# Patient Record
Sex: Male | Born: 1975
Health system: Southern US, Community
[De-identification: ages and names within clinical notes are randomized; demographics above are authoritative.]

## PROBLEM LIST (undated history)

## (undated) DIAGNOSIS — E559 Vitamin D deficiency, unspecified: Secondary | ICD-10-CM

## (undated) HISTORY — PX: OTHER SURGICAL HISTORY: SHX169

## (undated) HISTORY — DX: Vitamin D deficiency, unspecified: E55.9

---

## 1998-01-28 ENCOUNTER — Ambulatory Visit (HOSPITAL_COMMUNITY): Admission: RE | Admit: 1998-01-28 | Discharge: 1998-01-28 | Payer: Self-pay | Admitting: *Deleted

## 2019-04-21 ENCOUNTER — Ambulatory Visit: Payer: Self-pay | Admitting: Cardiology

## 2019-06-19 ENCOUNTER — Other Ambulatory Visit: Payer: Self-pay

## 2019-06-19 ENCOUNTER — Ambulatory Visit (INDEPENDENT_AMBULATORY_CARE_PROVIDER_SITE_OTHER): Payer: BC Managed Care – PPO | Admitting: Cardiology

## 2019-06-19 ENCOUNTER — Encounter: Payer: Self-pay | Admitting: Cardiology

## 2019-06-19 VITALS — BP 136/72 | HR 90 | Ht 72.0 in | Wt 238.4 lb

## 2019-06-19 DIAGNOSIS — E782 Mixed hyperlipidemia: Secondary | ICD-10-CM

## 2019-06-19 DIAGNOSIS — R011 Cardiac murmur, unspecified: Secondary | ICD-10-CM | POA: Insufficient documentation

## 2019-06-19 DIAGNOSIS — R9431 Abnormal electrocardiogram [ECG] [EKG]: Secondary | ICD-10-CM

## 2019-06-19 DIAGNOSIS — Z13220 Encounter for screening for lipoid disorders: Secondary | ICD-10-CM

## 2019-06-19 DIAGNOSIS — E663 Overweight: Secondary | ICD-10-CM | POA: Insufficient documentation

## 2019-06-19 HISTORY — DX: Overweight: E66.3

## 2019-06-19 HISTORY — DX: Mixed hyperlipidemia: E78.2

## 2019-06-19 HISTORY — DX: Cardiac murmur, unspecified: R01.1

## 2019-06-19 HISTORY — DX: Abnormal electrocardiogram (ECG) (EKG): R94.31

## 2019-06-19 NOTE — Patient Instructions (Signed)
Medication Instructions:  Your physician recommends that you continue on your current medications as directed. Please refer to the Current Medication list given to you today.  *If you need a refill on your cardiac medications before your next appointment, please call your pharmacy*  Lab Work: Your physician recommends that you return FASTING for a BMP, hepatic and lipid to be drawn  If you have labs (blood work) drawn today and your tests are completely normal, you will receive your results only by: Marland Kitchen MyChart Message (if you have MyChart) OR . A paper copy in the mail If you have any lab test that is abnormal or we need to change your treatment, we will call you to review the results.  Testing/Procedures: You had an EKG performed today  YOU HAVE BEEN scheduled for a cardiac CT score to be performed on July 04, 2019 at 4 pm at [address], [place]. THERE IS A $150 fee due at time of service  Your physician has requested that you have an echocardiogram. Echocardiography is a painless test that uses sound waves to create images of your heart. It provides your doctor with information about the size and shape of your heart and how well your heart's chambers and valves are working. This procedure takes approximately one hour. There are no restrictions for this procedure.   Follow-Up: At [facility], you and your health needs are our priority.  As part of our continuing mission to provide you with exceptional heart care, we have created designated Provider Care Teams.  These Care Teams include your primary Cardiologist (physician) and Advanced Practice Providers (APPs -  Physician Assistants and Nurse Practitioners) who all work together to provide you with the care you need, when you need it.  Your next appointment:   6 weeks  The format for your next appointment:   In Person  Provider:   Jyl Heinz, MD  Other Instructions  Echocardiogram An echocardiogram is  a procedure that uses painless sound waves (ultrasound) to produce an image of the heart. Images from an echocardiogram can provide important information about:  Signs of coronary artery disease (CAD).  Aneurysm detection. An aneurysm is a weak or damaged part of an artery wall that bulges out from the normal force of blood pumping through the body.  Heart size and shape. Changes in the size or shape of the heart can be associated with certain conditions, including heart failure, aneurysm, and CAD.  Heart muscle function.  Heart valve function.  Signs of a past heart attack.  Fluid buildup around the heart.  Thickening of the heart muscle.  A tumor or infectious growth around the heart valves. Tell a health care provider about:  Any allergies you have.  All medicines you are taking, including vitamins, herbs, eye drops, creams, and over-the-counter medicines.  Any blood disorders you have.  Any surgeries you have had.  Any medical conditions you have.  Whether you are pregnant or may be pregnant. What are the risks? Generally, this is a safe procedure. However, problems may occur, including:  Allergic reaction to dye (contrast) that may be used during the procedure. What happens before the procedure? No specific preparation is needed. You may eat and drink normally. What happens during the procedure?   An IV tube may be inserted into one of your veins.  You may receive contrast through this tube. A contrast is an injection that improves the quality of the pictures from your heart.  A  gel will be applied to your chest.  A wand-like tool (transducer) will be moved over your chest. The gel will help to transmit the sound waves from the transducer.  The sound waves will harmlessly bounce off of your heart to allow the heart images to be captured in real-time motion. The images will be recorded on a computer. The procedure may vary among health care providers and  hospitals. What happens after the procedure?  You may return to your normal, everyday life, including diet, activities, and medicines, unless your health care provider tells you not to do that. Summary  An echocardiogram is a procedure that uses painless sound waves (ultrasound) to produce an image of the heart.  Images from an echocardiogram can provide important information about the size and shape of your heart, heart muscle function, heart valve function, and fluid buildup around your heart.  You do not need to do anything to prepare before this procedure. You may eat and drink normally.  After the echocardiogram is completed, you may return to your normal, everyday life, unless your health care provider tells you not to do that. This information is not intended to replace advice given to you by your health care provider. Make sure you discuss any questions you have with your health care provider. Document Released: 07/31/2000 Document Revised: 11/24/2018 Document Reviewed: 09/05/2016 Elsevier Patient Education  [place].  Coronary Calcium Scan A coronary calcium scan is an imaging test used to look for deposits of calcium and other fatty materials (plaques) in the inner lining of the blood vessels of the heart (coronary arteries). These deposits of calcium and plaques can partly clog and narrow the coronary arteries without producing any symptoms or warning signs. This puts a person at risk for a heart attack. This test can detect these deposits before symptoms develop. Tell a health care provider about:  Any allergies you have.  All medicines you are taking, including vitamins, herbs, eye drops, creams, and over-the-counter medicines.  Any problems you or family members have had with anesthetic medicines.  Any blood disorders you have.  Any surgeries you have had.  Any medical conditions you have.  Whether you are pregnant or may be pregnant. What are the risks?  Generally, this is a safe procedure. However, problems may occur, including:  Harm to a pregnant woman and her unborn baby. This test involves the use of radiation. Radiation exposure can be dangerous to a pregnant woman and her unborn baby. If you are pregnant, you generally should not have this procedure done.  Slight increase in the risk of cancer. This is because of the radiation involved in the test. What happens before the procedure? No preparation is needed for this procedure. What happens during the procedure?   You will undress and remove any jewelry around your neck or chest.  You will put on a hospital gown.  Sticky electrodes will be placed on your chest. The electrodes will be connected to an electrocardiogram (ECG) machine to record a tracing of the electrical activity of your heart.  A CT scanner will take pictures of your heart. During this time, you will be asked to lie still and hold your breath for 2-3 seconds while a picture of your heart is being taken. The procedure may vary among health care providers and hospitals. What happens after the procedure?  You can get dressed.  You can return to your normal activities.  It is up to you to get the results  of your test. Ask your health care provider, or the department that is doing the test, when your results will be ready. Summary  A coronary calcium scan is an imaging test used to look for deposits of calcium and other fatty materials (plaques) in the inner lining of the blood vessels of the heart (coronary arteries).  Generally, this is a safe procedure. Tell your health care provider if you are pregnant or may be pregnant.  No preparation is needed for this procedure.  A CT scanner will take pictures of your heart.  You can return to your normal activities after the scan is done. This information is not intended to replace advice given to you by your health care provider. Make sure you discuss any questions you  have with your health care provider. Document Released: 01/30/2008 Document Revised: 07/16/2017 Document Reviewed: 06/22/2016 Elsevier Patient Education  2020 [employer].

## 2019-06-19 NOTE — Progress Notes (Signed)
Cardiology Office Note:    Date:  06/19/2019   ID:  Trevor Hardin, DOB 10/23/1975, MRN [id number]  PCP:  Sarajane Jews, FNP  Cardiologist:  Jenean Lindau, MD   Referring MD: Sarajane Jews, FNP    ASSESSMENT:    1. Cardiac murmur   2. Nonspecific abnormal electrocardiogram (ECG) (EKG)   3. Screening cholesterol level   4. Overweight   5. Mixed dyslipidemia    PLAN:    In order of problems listed above:  1. Primary prevention stressed with the patient.  Importance of compliance with diet and medication stressed and he vocalized understanding. 2. His blood pressure is stable 3. Mixed dyslipidemia: Diet was emphasized.  His primary care physician wanted him to be on statin therapy but is not keen on it now.  Risks of being overweight also stressed and he is going to do better. 4. Echocardiogram will be done to assess murmur heard on auscultation.  He will have a calcium CT scoring for his stratification.  He will be seen in follow-up appointment in 6 weeks or earlier if he has any concerns.  At that time we will do a follow-up liver lipid check.   Medication Adjustments/Labs and Tests Ordered: Current medicines are reviewed at length with the patient today.  Concerns regarding medicines are outlined above.  Orders Placed This Encounter  Procedures  . CT CARDIAC SCORING  . Basic Metabolic Panel (BMET)  . Hepatic function panel  . Lipid Profile  . EKG 12-Lead  . ECHOCARDIOGRAM COMPLETE   No orders of the defined types were placed in this encounter.    History of Present Illness:    [name] is a 43 y.o. male who is being seen today for the evaluation of abnormal EKG at the request of Sarajane Jews, FNP.  Patient is a pleasant 42 year old male.  He is a Engineer, structural by profession.  He mentions to me that he was referred here for abnormal EKG.  He leads a fairly sedentary lifestyle.  No chest pain orthopnea or PND.  He does not exercise on a regular basis.  His  activities at work but not give him any chest pain or chest tightness or shortness of breath.  At the time of my evaluation, the patient is alert awake oriented and in no distress.  Past Medical History:  Diagnosis Date  . Vitamin D deficiency     History reviewed. No pertinent surgical history.  Current Medications: Current Meds  Medication Sig  . cetirizine (ZYRTEC) 10 MG tablet Take 10 mg by mouth daily.     Allergies:   Patient has no known allergies.   Social History   Socioeconomic History  . Marital status: Married    Spouse name: Not on file  . Number of children: Not on file  . Years of education: Not on file  . Highest education level: Not on file  Occupational History  . Not on file  Social Needs  . Financial resource strain: Not on file  . Food insecurity    Worry: Not on file    Inability: Not on file  . Transportation needs    Medical: Not on file    Non-medical: Not on file  Tobacco Use  . Smoking status: Never Smoker  . Smokeless tobacco: Current User    Types: Chew  Substance and Sexual Activity  . Alcohol use: Not on file  . Drug use: Not on file  . Sexual activity: Not on  file  Lifestyle  . Physical activity    Days per week: Not on file    Minutes per session: Not on file  . Stress: Not on file  Relationships  . Social Herbalist on phone: Not on file    Gets together: Not on file    Attends religious service: Not on file    Active member of club or organization: Not on file    Attends meetings of clubs or organizations: Not on file    Relationship status: Not on file  Other Topics Concern  . Not on file  Social History Narrative  . Not on file     Family History: The patient's family history includes Endocrine tumor in his paternal grandmother; Thyroid disease in his mother.  ROS:   Please see the history of present illness.    All other systems reviewed and are negative.  EKGs/Labs/Other Studies Reviewed:    The  following studies were reviewed today: EKG was sinus rhythm and right bundle branch block and nonspecific ST-T changes.   Recent Labs: No results found for requested labs within last 8760 hours.  Recent Lipid Panel No results found for: CHOL, TRIG, HDL, CHOLHDL, VLDL, LDLCALC, LDLDIRECT  Physical Exam:    VS:  BP 136/72 (BP Location: Left Arm, Patient Position: Sitting, Cuff Size: Normal)   Pulse 90   Ht 6' (1.829 m)   Wt 238 lb 6.4 oz (108.1 kg)   SpO2 99%   BMI 32.33 kg/m     Wt Readings from Last 3 Encounters:  06/19/19 238 lb 6.4 oz (108.1 kg)     GEN: Patient is in no acute distress HEENT: Normal NECK: No JVD; No carotid bruits LYMPHATICS: No lymphadenopathy CARDIAC: S1 S2 regular, 2/6 systolic murmur at the apex. RESPIRATORY:  Clear to auscultation without rales, wheezing or rhonchi  ABDOMEN: Soft, non-tender, non-distended MUSCULOSKELETAL:  No edema; No deformity  SKIN: Warm and dry NEUROLOGIC:  Alert and oriented x 3 PSYCHIATRIC:  Normal affect    Signed, Jenean Lindau, MD  06/19/2019 2:32 PM    [facility]

## 2019-06-20 ENCOUNTER — Telehealth: Payer: Self-pay | Admitting: Cardiology

## 2019-06-20 NOTE — Telephone Encounter (Signed)
Wants to know why he needs an echo

## 2019-06-21 NOTE — Telephone Encounter (Signed)
Called patient informed him of Dr. Julien Nordmann last office visit note as to the reason for the echocardiogram. He verbally understood, no further questions.

## 2019-06-21 NOTE — Telephone Encounter (Signed)
My note says it and the diagnosis.  Cardiac murmur.

## 2019-06-21 NOTE — Telephone Encounter (Signed)
Please call patient about testing

## 2019-06-22 ENCOUNTER — Other Ambulatory Visit: Payer: BC Managed Care – PPO

## 2019-06-23 ENCOUNTER — Ambulatory Visit (HOSPITAL_BASED_OUTPATIENT_CLINIC_OR_DEPARTMENT_OTHER)
Admission: RE | Admit: 2019-06-23 | Discharge: 2019-06-23 | Disposition: A | Discharge status: Home / Self Care | Payer: BC Managed Care – PPO | Source: Ambulatory Visit | Attending: Cardiology | Admitting: Cardiology

## 2019-06-23 ENCOUNTER — Other Ambulatory Visit: Payer: Self-pay

## 2019-06-23 DIAGNOSIS — R9431 Abnormal electrocardiogram [ECG] [EKG]: Secondary | ICD-10-CM | POA: Insufficient documentation

## 2019-06-23 DIAGNOSIS — R011 Cardiac murmur, unspecified: Secondary | ICD-10-CM | POA: Diagnosis not present

## 2019-06-23 NOTE — Progress Notes (Signed)
  Echocardiogram 2D Echocardiogram has been performed.   Cardell Peach 06/23/2019, 3:45 PM

## 2019-06-28 ENCOUNTER — Telehealth: Payer: Self-pay

## 2019-06-28 NOTE — Telephone Encounter (Signed)
Results relayed, copy sent to Dr. Hassell Done

## 2019-06-28 NOTE — Telephone Encounter (Signed)
-----   Message from Jenean Lindau, MD sent at 06/26/2019 10:40 AM EST ----- The results of the study is unremarkable.  Mildly dilated ascending aorta.  Please inform patient. I will discuss in detail at next appointment. Cc  primary care/referring physician Jenean Lindau, MD 06/26/2019 10:39 AM

## 2019-07-04 ENCOUNTER — Other Ambulatory Visit: Payer: Self-pay

## 2019-07-04 ENCOUNTER — Ambulatory Visit (INDEPENDENT_AMBULATORY_CARE_PROVIDER_SITE_OTHER)
Admission: RE | Admit: 2019-07-04 | Discharge: 2019-07-04 | Disposition: A | Discharge status: Home / Self Care | Payer: Self-pay | Source: Ambulatory Visit | Attending: Cardiology | Admitting: Cardiology

## 2019-07-04 DIAGNOSIS — R9431 Abnormal electrocardiogram [ECG] [EKG]: Secondary | ICD-10-CM

## 2019-07-27 DIAGNOSIS — R011 Cardiac murmur, unspecified: Secondary | ICD-10-CM | POA: Diagnosis not present

## 2019-07-27 DIAGNOSIS — R9431 Abnormal electrocardiogram [ECG] [EKG]: Secondary | ICD-10-CM | POA: Diagnosis not present

## 2019-07-27 DIAGNOSIS — Z13220 Encounter for screening for lipoid disorders: Secondary | ICD-10-CM | POA: Diagnosis not present

## 2019-07-27 LAB — BASIC METABOLIC PANEL
BUN/Creatinine Ratio: 16 (ref 9–20)
BUN: 15 mg/dL (ref 6–24)
CO2: 23 mmol/L (ref 20–29)
Calcium: 9.7 mg/dL (ref 8.7–10.2)
Chloride: 101 mmol/L (ref 96–106)
Creatinine, Ser: 0.95 mg/dL (ref 0.76–1.27)
GFR calc Af Amer: 113 mL/min/{1.73_m2} (ref >59)
GFR calc non Af Amer: 98 mL/min/{1.73_m2} (ref >59)
Glucose: 98 mg/dL (ref 65–99)
Potassium: 4.6 mmol/L (ref 3.5–5.2)
Sodium: 140 mmol/L (ref 134–144)

## 2019-07-27 LAB — HEPATIC FUNCTION PANEL
ALT: 20 IU/L (ref 0–44)
AST: 19 IU/L (ref 0–40)
Albumin: 5 g/dL (ref 4.0–5.0)
Alkaline Phosphatase: 60 IU/L (ref 39–117)
Bilirubin Total: 0.9 mg/dL (ref 0.0–1.2)
Bilirubin, Direct: 0.17 mg/dL (ref 0.00–0.40)
Total Protein: 7.3 g/dL (ref 6.0–8.5)

## 2019-07-27 LAB — LIPID PANEL
Chol/HDL Ratio: 5.1 ratio — ABNORMAL HIGH (ref 0.0–5.0)
Cholesterol, Total: 205 mg/dL — ABNORMAL HIGH (ref 100–199)
HDL: 40 mg/dL (ref >39)
LDL Chol Calc (NIH): 133 mg/dL — ABNORMAL HIGH (ref 0–99)
Triglycerides: 178 mg/dL — ABNORMAL HIGH (ref 0–149)
VLDL Cholesterol Cal: 32 mg/dL (ref 5–40)

## 2019-07-31 ENCOUNTER — Ambulatory Visit (INDEPENDENT_AMBULATORY_CARE_PROVIDER_SITE_OTHER): Payer: BC Managed Care – PPO | Admitting: Cardiology

## 2019-07-31 ENCOUNTER — Encounter: Payer: Self-pay | Admitting: Cardiology

## 2019-07-31 ENCOUNTER — Other Ambulatory Visit: Payer: Self-pay

## 2019-07-31 VITALS — BP 112/82 | HR 106 | Ht 72.0 in | Wt 240.0 lb

## 2019-07-31 DIAGNOSIS — E782 Mixed hyperlipidemia: Secondary | ICD-10-CM

## 2019-07-31 DIAGNOSIS — I77810 Thoracic aortic ectasia: Secondary | ICD-10-CM | POA: Insufficient documentation

## 2019-07-31 DIAGNOSIS — R9431 Abnormal electrocardiogram [ECG] [EKG]: Secondary | ICD-10-CM | POA: Diagnosis not present

## 2019-07-31 HISTORY — DX: Thoracic aortic ectasia: I77.810

## 2019-07-31 MED ORDER — ATORVASTATIN CALCIUM 10 MG PO TABS: 10.0000 mg | ORAL_TABLET | Freq: Every day | ORAL | 8 refills | Status: DC

## 2019-07-31 NOTE — Patient Instructions (Signed)
Medication Instructions:  Your physician has recommended you make the following change in your medication:   START: ATORVASTATIN 10 mg daily  *If you need a refill on your cardiac medications before your next appointment, please call your pharmacy*  Lab Work: Your physician recommends that you return for FASTING LAB WORK IN: 6 WEEKS:       BMET, LFT's, Lipids  If you have labs (blood work) drawn today and your tests are completely normal, you will receive your results only by: Marland Kitchen MyChart Message (if you have MyChart) OR . A paper copy in the mail If you have any lab test that is abnormal or we need to change your treatment, we will call you to review the results.  Testing/Procedures: None Ordered  Follow-Up: At [facility], you and your health needs are our priority.  As part of our continuing mission to provide you with exceptional heart care, we have created designated Provider Care Teams.  These Care Teams include your primary Cardiologist (physician) and Advanced Practice Providers (APPs -  Physician Assistants and Nurse Practitioners) who all work together to provide you with the care you need, when you need it.  Your next appointment:   9 month(s)  The format for your next appointment:   In Person  Provider:   Jyl Heinz, MD

## 2019-07-31 NOTE — Progress Notes (Signed)
Cardiology Office Note:    Date:  07/31/2019   ID:  Trevor Hardin, DOB 1976-05-18, MRN [id number]  PCP:  Sarajane Jews, FNP  Cardiologist:  Jenean Lindau, MD   Referring MD: Sarajane Jews, FNP    ASSESSMENT:    1. Mixed dyslipidemia   2. Ascending aorta dilation (HCC)    PLAN:    In order of problems listed above:  1. I reviewed echocardiogram reports and CT scan report and the ascending aorta.  Normal.  I discussed this with him at extensive length.  He vocalized understanding.  His blood pressure stable questions were answered to his satisfaction. 2. Mixed dyslipidemia: In view of the above I would like to keep his lipids under better control and diet was discussed.  Importance of regular exercise stressed I initiated him on atorvastatin 10 mg daily.  Benefits and potential risks explained.  He will be back in 6 weeks for liver lipid check in 9 months for follow-up.  At that time we will do a CT chest to evaluate for follow-up on the ascending aorta.  I gave him some education about symptoms to look for such as chest pain and such and its details were made available to the patient.  He vocalized understanding.   Medication Adjustments/Labs and Tests Ordered: Current medicines are reviewed at length with the patient today.  Concerns regarding medicines are outlined above.  No orders of the defined types were placed in this encounter.  No orders of the defined types were placed in this encounter.    Chief Complaint  Patient presents with  . Follow-up     History of Present Illness:    Trevor Hardin is a 43 y.o. male.  Patient has past medical history of mixed dyslipidemia.  His lipids were elevated.  He denies any problems at this time and takes care of activities of daily living.  No chest pain orthopnea or PND.  He is an active Engineer, structural.  At the time of my evaluation, the patient is alert awake oriented and in no distress.  Past Medical History:  Diagnosis  Date  . Vitamin D deficiency     No past surgical history on file.  Current Medications: Current Meds  Medication Sig  . cetirizine (ZYRTEC) 10 MG tablet Take 10 mg by mouth daily.     Allergies:   Patient has no known allergies.   Social History   Socioeconomic History  . Marital status: Married    Spouse name: Not on file  . Number of children: Not on file  . Years of education: Not on file  . Highest education level: Not on file  Occupational History  . Not on file  Tobacco Use  . Smoking status: Never Smoker  . Smokeless tobacco: Current User    Types: Chew  Substance and Sexual Activity  . Alcohol use: Not on file  . Drug use: Not on file  . Sexual activity: Not on file  Other Topics Concern  . Not on file  Social History Narrative  . Not on file   Social Determinants of Health   Financial Resource Strain:   . Difficulty of Paying Living Expenses: Not on file  Food Insecurity:   . Worried About Charity fundraiser in the Last Year: Not on file  . Ran Out of Food in the Last Year: Not on file  Transportation Needs:   . Lack of Transportation (Medical): Not on file  . Lack  of Transportation (Non-Medical): Not on file  Physical Activity:   . Days of Exercise per Week: Not on file  . Minutes of Exercise per Session: Not on file  Stress:   . Feeling of Stress : Not on file  Social Connections:   . Frequency of Communication with Friends and Family: Not on file  . Frequency of Social Gatherings with Friends and Family: Not on file  . Attends Religious Services: Not on file  . Active Member of Clubs or Organizations: Not on file  . Attends Archivist Meetings: Not on file  . Marital Status: Not on file     Family History: The patient's family history includes Endocrine tumor in his paternal grandmother; Thyroid disease in his mother.  ROS:   Please see the history of present illness.    All other systems reviewed and are  negative.  EKGs/Labs/Other Studies Reviewed:    The following studies were reviewed today: EXAM: Coronary Calcium Score  PROCEDURE: The patient was scanned on a [employer]. Axial non-contrast 3 mm slices were carried out through the heart. The data set was analyzed on a dedicated work station and scored using the [place].  FINDINGS: Non-cardiac: See separate report from [facility].  Ascending Aorta: Top normal size at 38 mm.  Aortic atherosclerosis.  Pericardium: Normal  Coronary arteries: Normal origin.  IMPRESSION: Coronary calcium score of 1. This is at least the 75th percentile compared to age (59) and sex matched controls ([place])  Electronically Signed: By: Pixie Casino M.D. On: 07/04/2019 17:05    Recent Labs: 07/27/2019: ALT 20; BUN 15; Creatinine, Ser 0.95; Potassium 4.6; Sodium 140  Recent Lipid Panel    Component Value Date/Time   CHOL 205 (H) 07/27/2019 1031   TRIG 178 (H) 07/27/2019 1031   HDL 40 07/27/2019 1031   CHOLHDL 5.1 (H) 07/27/2019 1031   LDLCALC 133 (H) 07/27/2019 1031    Physical Exam:    VS:  BP 112/82 (BP Location: Right Arm, Patient Position: Sitting, Cuff Size: Normal)   Pulse (!) 106   Ht 6' (1.829 m)   Wt 240 lb (108.9 kg)   SpO2 96%   BMI 32.55 kg/m     Wt Readings from Last 3 Encounters:  07/31/19 240 lb (108.9 kg)  06/19/19 238 lb 6.4 oz (108.1 kg)     GEN: Patient is in no acute distress HEENT: Normal NECK: No JVD; No carotid bruits LYMPHATICS: No lymphadenopathy CARDIAC: Hear sounds regular, 2/6 systolic murmur at the apex. RESPIRATORY:  Clear to auscultation without rales, wheezing or rhonchi  ABDOMEN: Soft, non-tender, non-distended MUSCULOSKELETAL:  No edema; No deformity  SKIN: Warm and dry NEUROLOGIC:  Alert and oriented x 3 PSYCHIATRIC:  Normal affect   Signed, Jenean Lindau, MD  07/31/2019 2:08 PM    [facility]

## 2020-04-06 IMAGING — CT CT HEART SCORING
2 series · 16 of 20 positions shown, 18 images · non-contrast
Comparison: None.

Addendum:
HISTORY OF PRESENT ILLNESS:
Risk stratification

EXAM:
Coronary Calcium Score
PROCEDURE:
The patient was scanned on a Siemens Force scanner. Axial
non-contrast 3 mm slices were carried out through the heart. The
data set was analyzed on a dedicated work station and scored using
the [place]atson method.

[Series 2: casc 3.0 i36f 2 bestdiast 78 % · axial · 0.42mm/px · z∈[-260,-155]mm · 8 of 45 slices shown, 10 images]
[im 5/45  vessel]
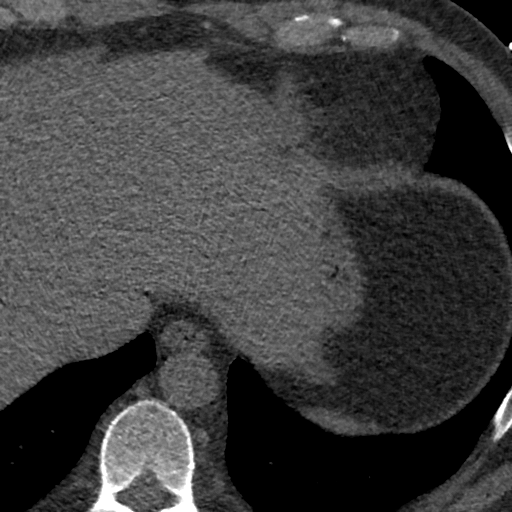
[im 5/45  lung]
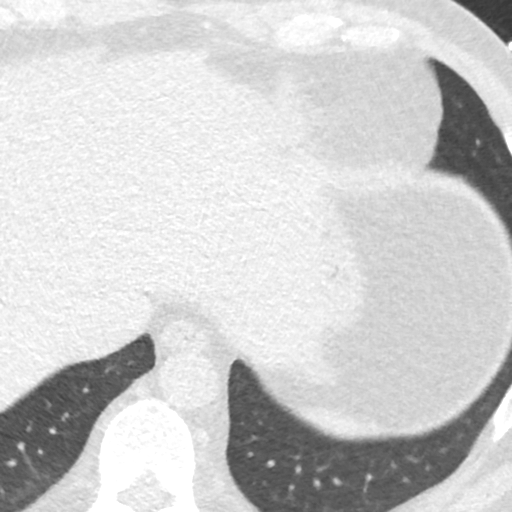
[im 10/45  vessel]
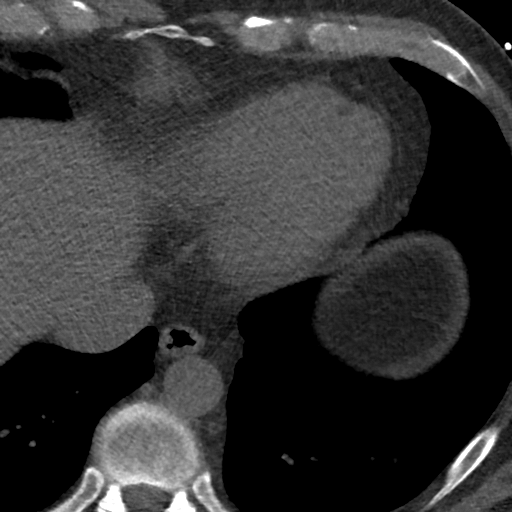
[im 15/45  vessel]
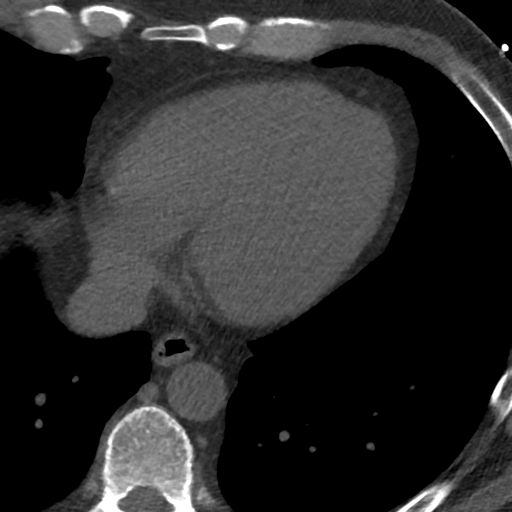
[im 20/45  vessel]
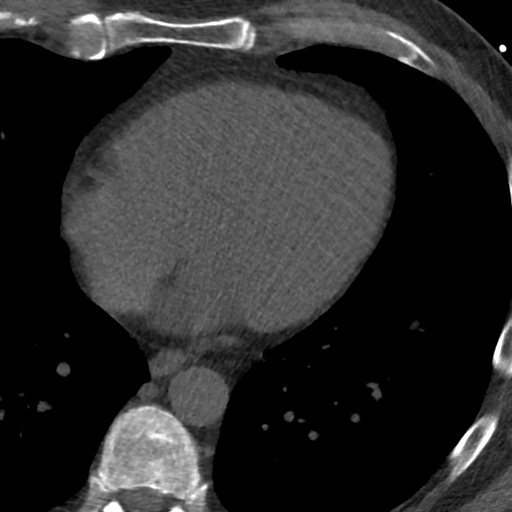
[im 25/45  vessel]
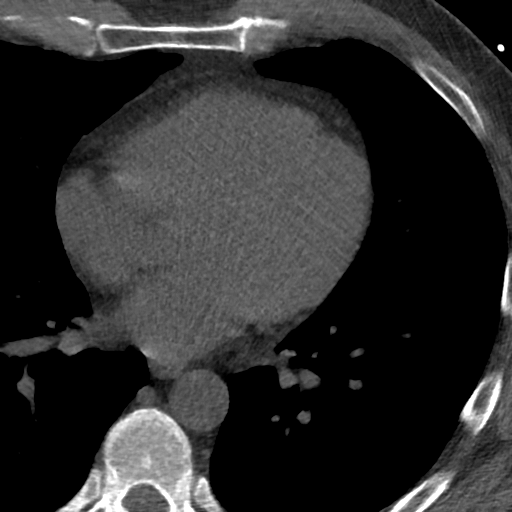
[im 25/45  lung]
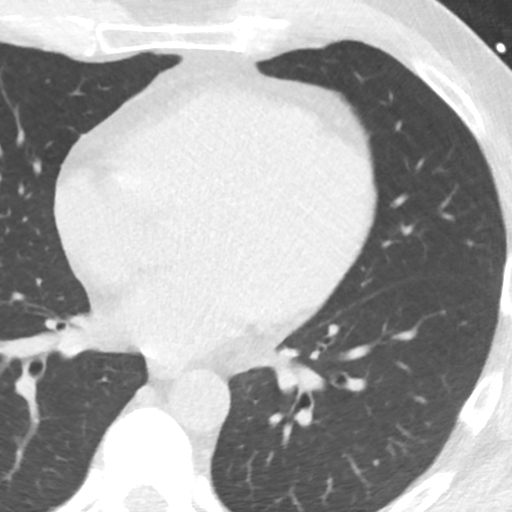
[im 30/45  vessel]
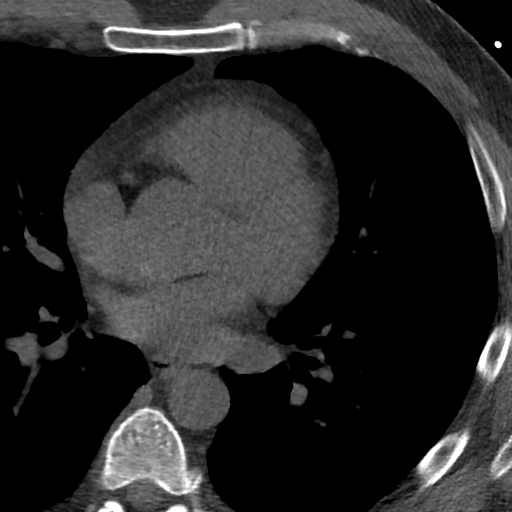
[im 35/45  vessel]
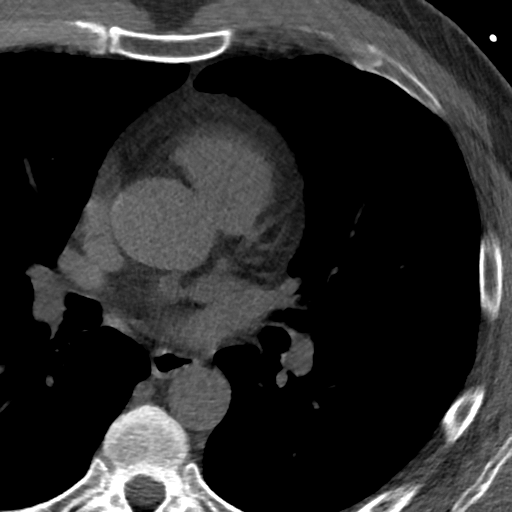
[im 40/45  vessel]
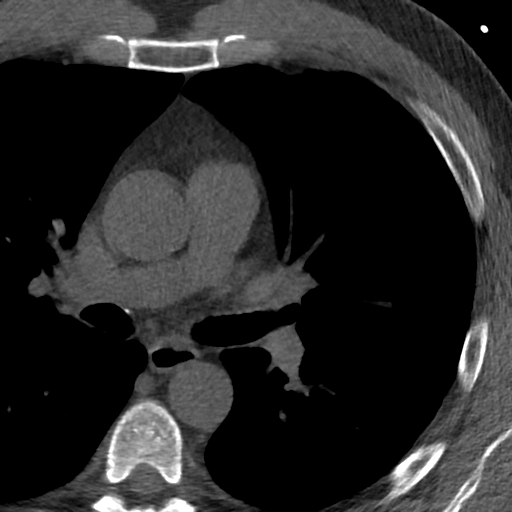

[Series 4: lung st 78 % · axial · 0.76mm/px · z∈[-260,-155]mm · 8 of 45 slices shown]
[im 5/45  lung]
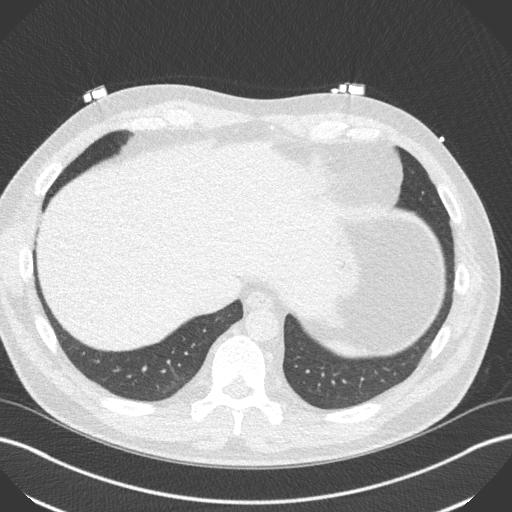
[im 10/45  lung]
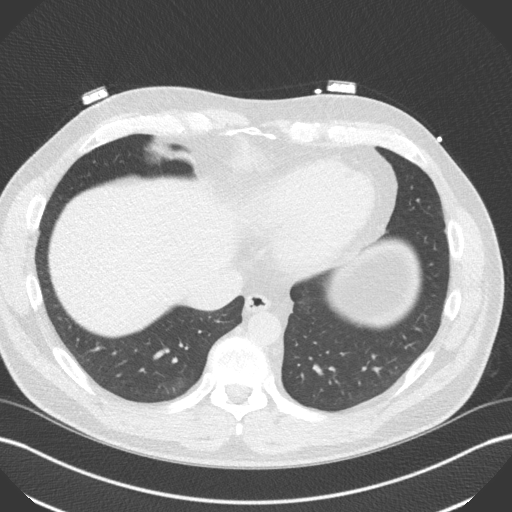
[im 15/45  lung]
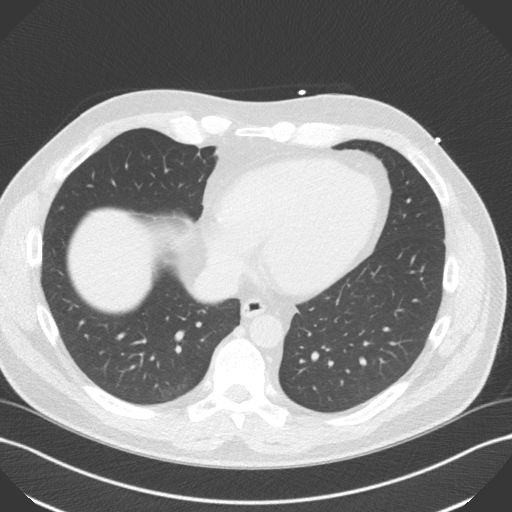
[im 20/45  lung]
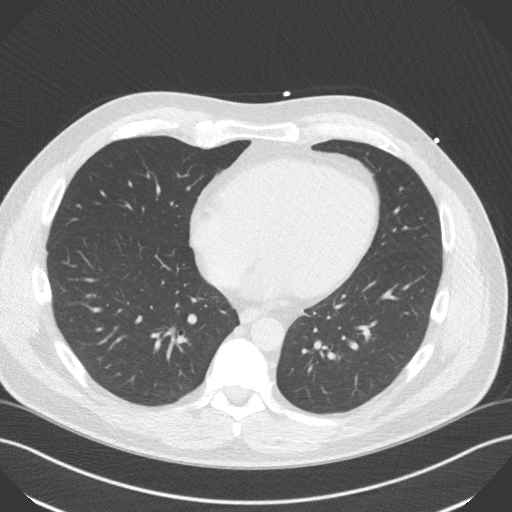
[im 25/45  lung]
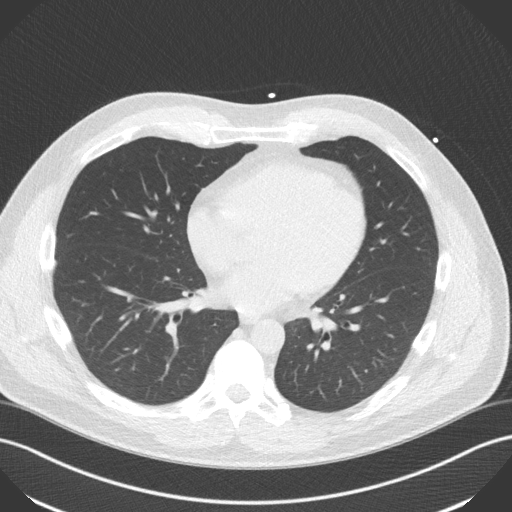
[im 30/45  lung]
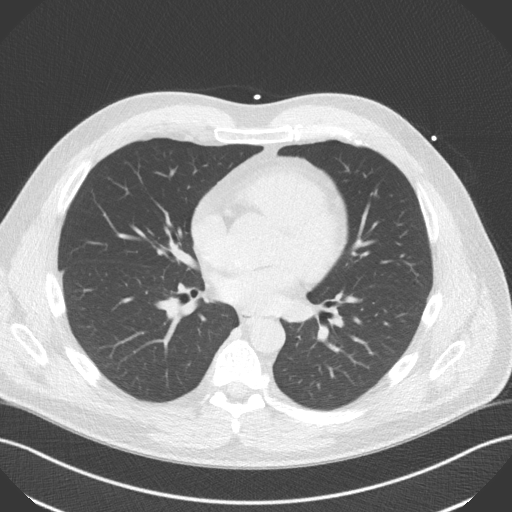
[im 35/45  lung]
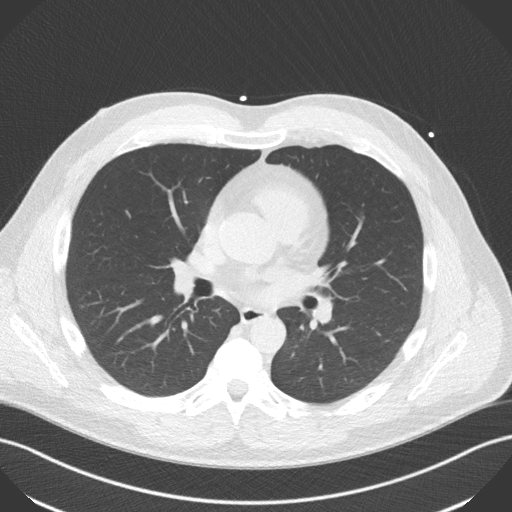
[im 40/45  lung]
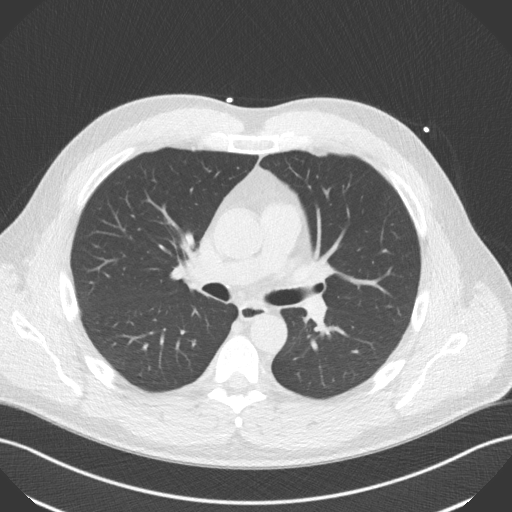

[16 of 20 positions shown; findings below may reference images not displayed]

FINDINGS: Non-cardiac: See separate report from [REDACTED].

Ascending Aorta: Top normal size at 38 mm.  Aortic atherosclerosis.

Pericardium: Normal

Coronary arteries: Normal origin.
IMPRESSION: Coronary calcium score of 1. This is at least the 75th percentile
compared to age (45) and sex matched controls ([HOSPITAL])

EXAM:
OVER-READ INTERPRETATION  CT CHEST

The following report is an over-read performed by radiologist Dr.
Ervista Ceola [REDACTED] on 07/04/2019. This over-read
does not include interpretation of cardiac or coronary anatomy or
pathology. The coronary calcium score interpretation by the
cardiologist is attached.
FINDINGS: Heart is normal size. Aorta is normal caliber. No adenopathy in the
lower mediastinum or hila. Visualized lungs clear. No effusions.

Imaging into the upper abdomen shows no acute findings. Chest wall
soft tissues are unremarkable. No acute bony abnormality.
IMPRESSION: No acute or significant extracardiac abnormality.

*** End of Addendum ***
HISTORY OF PRESENT ILLNESS:
Risk stratification

EXAM:
Coronary Calcium Score

PROCEDURE:
The patient was scanned on a Siemens Force scanner. Axial
non-contrast 3 mm slices were carried out through the heart. The
data set was analyzed on a dedicated work station and scored using
the Agatson method.
FINDINGS: Non-cardiac: See separate report from [REDACTED].

Ascending Aorta: Top normal size at 38 mm.  Aortic atherosclerosis.

Pericardium: Normal

Coronary arteries: Normal origin.
IMPRESSION: Coronary calcium score of 1. This is at least the 75th percentile
compared to age (45) and sex matched controls ([HOSPITAL])

## 2020-04-26 ENCOUNTER — Other Ambulatory Visit: Payer: Self-pay

## 2020-04-26 ENCOUNTER — Ambulatory Visit: Payer: BC Managed Care – PPO | Admitting: Cardiology

## 2020-04-26 ENCOUNTER — Encounter: Payer: Self-pay | Admitting: Cardiology

## 2020-04-26 VITALS — BP 114/88 | HR 88 | Ht 72.0 in | Wt 222.6 lb

## 2020-04-26 DIAGNOSIS — E663 Overweight: Secondary | ICD-10-CM

## 2020-04-26 DIAGNOSIS — I77810 Thoracic aortic ectasia: Secondary | ICD-10-CM | POA: Diagnosis not present

## 2020-04-26 DIAGNOSIS — E782 Mixed hyperlipidemia: Secondary | ICD-10-CM

## 2020-04-26 NOTE — Progress Notes (Signed)
Cardiology Office Note:    Date:  04/26/2020   ID:  BARTLOMIEJ JENKINSON, DOB 1976/06/29, MRN [id number]  PCP:  Sarajane Jews, FNP  Cardiologist:  Jenean Lindau, MD   Referring MD: Sarajane Jews, FNP    ASSESSMENT:    1. Mixed dyslipidemia   2. Overweight   3. Ascending aorta dilation (HCC)    PLAN:    In order of problems listed above:  1. Primary prevention stressed with the patient.  Importance of compliance with diet medication stressed and he vocalized understanding.  Importance of regular exercise stressed. 2. Mixed dyslipidemia: Diet was emphasized.  He is fasting and will have complete blood work today.  He is on statin therapy. 3. Ascending aortic dilatation: Patient will undergo CT scan to evaluate and compare from the CT scan done last year.  He is doing well with exercise 4. Overweight: Patient has lost significant amount of weight with diet and exercise.  He is done both of these with moderation and I congratulated him about it. 5. Patient will be seen in follow-up appointment in 6 months or earlier if the patient has any concerns    Medication Adjustments/Labs and Tests Ordered: Current medicines are reviewed at length with the patient today.  Concerns regarding medicines are outlined above.  No orders of the defined types were placed in this encounter.  No orders of the defined types were placed in this encounter.    No chief complaint on file.    History of Present Illness:    Trevor Hardin is a 44 y.o. male.  Patient has past medical history of mixed dyslipidemia and dilated ascending aorta.  He denies any problems at this time and takes care of activities of daily living.  No chest pain orthopnea or PND.  He is pretty attention to diet and exercise and has lost weight.  At the time of my evaluation, the patient is alert awake oriented and in no distress.  Past Medical History:  Diagnosis Date  . Vitamin D deficiency     Past Surgical History:   Procedure Laterality Date  . no past surg      Current Medications: Current Meds  Medication Sig  . cetirizine (ZYRTEC) 10 MG tablet Take 10 mg by mouth daily.     Allergies:   Patient has no known allergies.   Social History   Socioeconomic History  . Marital status: Married    Spouse name: Not on file  . Number of children: Not on file  . Years of education: Not on file  . Highest education level: Not on file  Occupational History  . Not on file  Tobacco Use  . Smoking status: Never Smoker  . Smokeless tobacco: Current User    Types: Chew  Substance and Sexual Activity  . Alcohol use: Not on file  . Drug use: Not on file  . Sexual activity: Not on file  Other Topics Concern  . Not on file  Social History Narrative  . Not on file   Social Determinants of Health   Financial Resource Strain:   . Difficulty of Paying Living Expenses: Not on file  Food Insecurity:   . Worried About Charity fundraiser in the Last Year: Not on file  . Ran Out of Food in the Last Year: Not on file  Transportation Needs:   . Lack of Transportation (Medical): Not on file  . Lack of Transportation (Non-Medical): Not on file  Physical Activity:   .  Days of Exercise per Week: Not on file  . Minutes of Exercise per Session: Not on file  Stress:   . Feeling of Stress : Not on file  Social Connections:   . Frequency of Communication with Friends and Family: Not on file  . Frequency of Social Gatherings with Friends and Family: Not on file  . Attends Religious Services: Not on file  . Active Member of Clubs or Organizations: Not on file  . Attends Archivist Meetings: Not on file  . Marital Status: Not on file     Family History: The patient's family history includes Endocrine tumor in his paternal grandmother; Thyroid disease in his mother.  ROS:   Please see the history of present illness.    All other systems reviewed and are negative.  EKGs/Labs/Other Studies  Reviewed:    The following studies were reviewed today:  EXAM: OVER-READ INTERPRETATION  CT CHEST  The following report is an over-read performed by radiologist Dr. Collene Leyden [facility] on 07/04/2019. This over-read does not include interpretation of cardiac or coronary anatomy or pathology. The coronary calcium score interpretation by the cardiologist is attached.  COMPARISON:  None.  FINDINGS: Heart is normal size. Aorta is normal caliber. No adenopathy in the lower mediastinum or hila. Visualized lungs clear. No effusions.  Imaging into the upper abdomen shows no acute findings. Chest wall soft tissues are unremarkable. No acute bony abnormality.  IMPRESSION: No acute or significant extracardiac abnormality.   Electronically Signed   By: Rolm Baptise M.D.   On: 07/04/2019 19:40   Addended by Rolm Baptise, MD on 07/04/2019 7:42 PM  Study Result  Addenda  ADDENDUM REPORT: 07/04/2019 19:40  EXAM: OVER-READ INTERPRETATION  CT CHEST  The following report is an over-read performed by radiologist Dr. Collene Leyden [facility] [place] on 07/04/2019. This over-read does not include interpretation of cardiac or coronary anatomy or pathology. The coronary calcium score interpretation by the cardiologist is attached.  COMPARISON:  None.  FINDINGS: Heart is normal size. Aorta is normal caliber. No adenopathy in the lower mediastinum or hila. Visualized lungs clear. No effusions.  Imaging into the upper abdomen shows no acute findings. Chest wall soft tissues are unremarkable. No acute bony abnormality.  IMPRESSION: No acute or significant extracardiac abnormality.   Electronically Signed   By: Rolm Baptise M.D.   On: 07/04/2019 19:40   Signed by Rolm Baptise, MD on 07/04/2019 7:42 PM  Narrative & Impression  HISTORY OF PRESENT ILLNESS: Risk stratification  EXAM: Coronary Calcium Score  PROCEDURE: The patient was  scanned on a [employer] scanner. Axial non-contrast 3 mm slices were carried out through the heart. The data set was analyzed on a dedicated work station and scored using the [place].  FINDINGS: Non-cardiac: See separate report from [facility].  Ascending Aorta: Top normal size at 38 mm.  Aortic atherosclerosis.  Pericardium: Normal  Coronary arteries: Normal origin.  IMPRESSION: Coronary calcium score of 1. This is at least the 75th percentile compared to age (75) and sex matched controls ([place])  Electronically Signed: By: Pixie Casino M.D. On: 07/04/2019 17:05      Recent Labs: 07/27/2019: ALT 20; BUN 15; Creatinine, Ser 0.95; Potassium 4.6; Sodium 140  Recent Lipid Panel    Component Value Date/Time   CHOL 205 (H) 07/27/2019 1031   TRIG 178 (H) 07/27/2019 1031   HDL 40 07/27/2019 1031   CHOLHDL 5.1 (H) 07/27/2019 1031   LDLCALC  133 (H) 07/27/2019 1031    Physical Exam:    VS:  BP 114/88   Pulse 88   Ht 6' (1.829 m)   Wt 222 lb 9.6 oz (101 kg)   SpO2 98%   BMI 30.19 kg/m     Wt Readings from Last 3 Encounters:  04/26/20 222 lb 9.6 oz (101 kg)  07/31/19 240 lb (108.9 kg)  06/19/19 238 lb 6.4 oz (108.1 kg)     GEN: Patient is in no acute distress HEENT: Normal NECK: No JVD; No carotid bruits LYMPHATICS: No lymphadenopathy CARDIAC: Hear sounds regular, 2/6 systolic murmur at the apex. RESPIRATORY:  Clear to auscultation without rales, wheezing or rhonchi  ABDOMEN: Soft, non-tender, non-distended MUSCULOSKELETAL:  No edema; No deformity  SKIN: Warm and dry NEUROLOGIC:  Alert and oriented x 3 PSYCHIATRIC:  Normal affect   Signed, Jenean Lindau, MD  04/26/2020 3:54 PM    [place]

## 2020-04-26 NOTE — Patient Instructions (Addendum)
Medication Instructions:  Your physician recommends that you continue on your current medications as directed. Please refer to the Current Medication list given to you today.  *If you need a refill on your cardiac medications before your next appointment, please call your pharmacy*   Lab Work: Bmp,Cbc,Tsh,Lft,Lipid- Today   If you have labs (blood work) drawn today and your tests are completely normal, you will receive your results only by: Marland Kitchen MyChart Message (if you have MyChart) OR . A paper copy in the mail If you have any lab test that is abnormal or we need to change your treatment, we will call you to review the results.   Testing/Procedures: Your physician has ordered for you to have a Chest CT at [place]: At [facility], you and your health needs are our priority.  As part of our continuing mission to provide you with exceptional heart care, we have created designated Provider Care Teams.  These Care Teams include your primary Cardiologist (physician) and Advanced Practice Providers (APPs -  Physician Assistants and Nurse Practitioners) who all work together to provide you with the care you need, when you need it.  We recommend signing up for the patient portal called "MyChart".  Sign up information is provided on this After Visit Summary.  MyChart is used to connect with patients for Virtual Visits (Telemedicine).  Patients are able to view lab/test results, encounter notes, upcoming appointments, etc.  Non-urgent messages can be sent to your provider as well.   To learn more about what you can do with MyChart, go to [email or web address].    Your next appointment:   9 month(s)  The format for your next appointment:   In Person  Provider:   Jyl Heinz, MD   Other Instructions None

## 2020-04-27 LAB — CBC
Hematocrit: 43.6 % (ref 37.5–51.0)
Hemoglobin: 15.3 g/dL (ref 13.0–17.7)
MCH: 31 pg (ref 26.6–33.0)
MCHC: 35.1 g/dL (ref 31.5–35.7)
MCV: 88 fL (ref 79–97)
Platelets: 195 10*3/uL (ref 150–450)
RBC: 4.93 x10E6/uL (ref 4.14–5.80)
RDW: 12.6 % (ref 11.6–15.4)
WBC: 5.2 10*3/uL (ref 3.4–10.8)

## 2020-04-27 LAB — BASIC METABOLIC PANEL
BUN/Creatinine Ratio: 14 (ref 9–20)
BUN: 15 mg/dL (ref 6–24)
CO2: 22 mmol/L (ref 20–29)
Calcium: 9.4 mg/dL (ref 8.7–10.2)
Chloride: 98 mmol/L (ref 96–106)
Creatinine, Ser: 1.08 mg/dL (ref 0.76–1.27)
GFR calc Af Amer: 97 mL/min/{1.73_m2} (ref >59)
GFR calc non Af Amer: 84 mL/min/{1.73_m2} (ref >59)
Glucose: 92 mg/dL (ref 65–99)
Potassium: 4.2 mmol/L (ref 3.5–5.2)
Sodium: 139 mmol/L (ref 134–144)

## 2020-04-27 LAB — HEPATIC FUNCTION PANEL
ALT: 20 IU/L (ref 0–44)
AST: 18 IU/L (ref 0–40)
Albumin: 4.9 g/dL (ref 4.0–5.0)
Alkaline Phosphatase: 65 IU/L (ref 48–121)
Bilirubin Total: 2.3 mg/dL — ABNORMAL HIGH (ref 0.0–1.2)
Bilirubin, Direct: 0.39 mg/dL (ref 0.00–0.40)
Total Protein: 7.3 g/dL (ref 6.0–8.5)

## 2020-04-27 LAB — LIPID PANEL
Chol/HDL Ratio: 3.9 ratio (ref 0.0–5.0)
Cholesterol, Total: 177 mg/dL (ref 100–199)
HDL: 45 mg/dL (ref >39)
LDL Chol Calc (NIH): 108 mg/dL — ABNORMAL HIGH (ref 0–99)
Triglycerides: 137 mg/dL (ref 0–149)
VLDL Cholesterol Cal: 24 mg/dL (ref 5–40)

## 2020-04-27 LAB — TSH: TSH: 0.862 u[IU]/mL (ref 0.450–4.500)

## 2020-04-29 MED ORDER — ATORVASTATIN CALCIUM 10 MG PO TABS: 10.0000 mg | ORAL_TABLET | Freq: Every day | ORAL | 3 refills | Status: DC

## 2020-04-29 NOTE — Addendum Note (Signed)
Addended by: Truddie Hidden on: 04/29/2020 05:20 PM   Modules accepted: Orders

## 2020-05-01 ENCOUNTER — Encounter: Payer: Self-pay | Admitting: Cardiology

## 2020-05-01 DIAGNOSIS — I77810 Thoracic aortic ectasia: Secondary | ICD-10-CM | POA: Diagnosis not present

## 2020-05-01 DIAGNOSIS — R918 Other nonspecific abnormal finding of lung field: Secondary | ICD-10-CM | POA: Diagnosis not present

## 2020-05-01 DIAGNOSIS — R911 Solitary pulmonary nodule: Secondary | ICD-10-CM | POA: Diagnosis not present

## 2020-05-08 ENCOUNTER — Telehealth: Payer: Self-pay | Admitting: Cardiology

## 2020-05-08 NOTE — Telephone Encounter (Signed)
Message left for pt to callback regarding his CT. CT is unremarkable and we will continue to watch yearly.

## 2020-05-08 NOTE — Telephone Encounter (Signed)
    Pt would like to get CT result. He said it was done at [facility] last 05/01/2020

## 2020-05-09 NOTE — Telephone Encounter (Signed)
Patient returning call.

## 2020-05-09 NOTE — Telephone Encounter (Signed)
Pt aware results reviewed with pt as per Dr. Julien Nordmann note.  Pt verbalized understanding and had no additional questions.

## 2021-01-02 DIAGNOSIS — E559 Vitamin D deficiency, unspecified: Secondary | ICD-10-CM | POA: Insufficient documentation

## 2021-01-03 ENCOUNTER — Ambulatory Visit: Payer: BC Managed Care – PPO | Admitting: Cardiology

## 2021-01-03 ENCOUNTER — Other Ambulatory Visit: Payer: Self-pay

## 2021-01-03 ENCOUNTER — Encounter: Payer: Self-pay | Admitting: Cardiology

## 2021-01-03 VITALS — BP 118/72 | HR 101 | Ht 72.0 in | Wt 225.4 lb

## 2021-01-03 DIAGNOSIS — E782 Mixed hyperlipidemia: Secondary | ICD-10-CM | POA: Diagnosis not present

## 2021-01-03 DIAGNOSIS — I77810 Thoracic aortic ectasia: Secondary | ICD-10-CM

## 2021-01-03 DIAGNOSIS — E663 Overweight: Secondary | ICD-10-CM | POA: Diagnosis not present

## 2021-01-03 NOTE — Patient Instructions (Signed)
Medication Instructions:  No medication changes. *If you need a refill on your cardiac medications before your next appointment, please call your pharmacy*   Lab Work: Your physician recommends that you have labs done in the office today. Your test included  basic metabolic panel, liver function and lipids.  If you have labs (blood work) drawn today and your tests are completely normal, you will receive your results only by: Marland Kitchen MyChart Message (if you have MyChart) OR . A paper copy in the mail If you have any lab test that is abnormal or we need to change your treatment, we will call you to review the results.   Testing/Procedures: None ordered   Follow-Up: At [facility], you and your health needs are our priority.  As part of our continuing mission to provide you with exceptional heart care, we have created designated Provider Care Teams.  These Care Teams include your primary Cardiologist (physician) and Advanced Practice Providers (APPs -  Physician Assistants and Nurse Practitioners) who all work together to provide you with the care you need, when you need it.  We recommend signing up for the patient portal called "MyChart".  Sign up information is provided on this After Visit Summary.  MyChart is used to connect with patients for Virtual Visits (Telemedicine).  Patients are able to view lab/test results, encounter notes, upcoming appointments, etc.  Non-urgent messages can be sent to your provider as well.   To learn more about what you can do with MyChart, go to [email or web address].    Your next appointment:   9 month(s)  The format for your next appointment:   In Person  Provider:   Jyl Heinz, MD   Other Instructions NA

## 2021-01-03 NOTE — Progress Notes (Signed)
Cardiology Office Note:    Date:  01/03/2021   ID:  Trevor Hardin, DOB June 08, 1976, MRN [id number]  PCP:  Pcp, No  Cardiologist:  Jenean Lindau, MD   Referring MD: Sarajane Jews, FNP    ASSESSMENT:    1. Ascending aorta dilation (HCC)   2. Overweight   3. Mixed dyslipidemia    PLAN:    In order of problems listed above:  1. Primary prevention stressed with the patient.  Importance of compliance with diet medication stressed any vocalized understanding. 2. Ascending aortic dilatation: Stable and we will recheck its in the next visit.  Precautions and education was given 3. Mixed dyslipidemia: Diet was emphasized.  He will be back in the next few days for complete blood work including fasting lipids 4. Patient was advised to exercise half an hour a day at least 5 days a week and he promises to do so.Patient will be seen in follow-up appointment in 9 months or earlier if the patient has any concerns    Medication Adjustments/Labs and Tests Ordered: Current medicines are reviewed at length with the patient today.  Concerns regarding medicines are outlined above.  No orders of the defined types were placed in this encounter.  No orders of the defined types were placed in this encounter.    No chief complaint on file.    History of Present Illness:    Trevor Hardin is a 45 y.o. male.  Patient has past medical history of ascending aortic dilatation which is mild and mixed dyslipidemia.  He denies any problems at this time and takes care of activities of daily living.  He exercises on a daily basis.  At the time of my evaluation, the patient is alert awake oriented and in no distress.  Past Medical History:  Diagnosis Date  . Ascending aorta dilation (HCC) 07/31/2019  . Cardiac murmur 06/19/2019  . Mixed dyslipidemia 06/19/2019  . Nonspecific abnormal electrocardiogram (ECG) (EKG) 06/19/2019  . Overweight 06/19/2019  . Vitamin D deficiency     Past Surgical History:   Procedure Laterality Date  . no past surg      Current Medications: Current Meds  Medication Sig  . atorvastatin (LIPITOR) 10 MG tablet Take 10 mg by mouth daily.  . cetirizine (ZYRTEC) 10 MG tablet Take 10 mg by mouth daily.     Allergies:   Patient has no known allergies.   Social History   Socioeconomic History  . Marital status: Married    Spouse name: Not on file  . Number of children: Not on file  . Years of education: Not on file  . Highest education level: Not on file  Occupational History  . Not on file  Tobacco Use  . Smoking status: Never Smoker  . Smokeless tobacco: Current User    Types: Chew  Substance and Sexual Activity  . Alcohol use: Not on file  . Drug use: Not on file  . Sexual activity: Not on file  Other Topics Concern  . Not on file  Social History Narrative  . Not on file   Social Determinants of Health   Financial Resource Strain: Not on file  Food Insecurity: Not on file  Transportation Needs: Not on file  Physical Activity: Not on file  Stress: Not on file  Social Connections: Not on file     Family History: The patient's family history includes Endocrine tumor in his paternal grandmother; Thyroid disease in his mother.  ROS:  Please see the history of present illness.    All other systems reviewed and are negative.  EKGs/Labs/Other Studies Reviewed:    The following studies were reviewed today: EKG reveals sinus rhythm and nonspecific ST-T changes   Recent Labs: 04/26/2020: ALT 20; BUN 15; Creatinine, Ser 1.08; Hemoglobin 15.3; Platelets 195; Potassium 4.2; Sodium 139; TSH 0.862  Recent Lipid Panel    Component Value Date/Time   CHOL 177 04/26/2020 1607   TRIG 137 04/26/2020 1607   HDL 45 04/26/2020 1607   CHOLHDL 3.9 04/26/2020 1607   LDLCALC 108 (H) 04/26/2020 1607    Physical Exam:    VS:  BP 118/72   Pulse (!) 101   Ht 6' (1.829 m)   Wt 225 lb 6.4 oz (102.2 kg)   SpO2 96%   BMI 30.57 kg/m     Wt  Readings from Last 3 Encounters:  01/03/21 225 lb 6.4 oz (102.2 kg)  04/26/20 222 lb 9.6 oz (101 kg)  07/31/19 240 lb (108.9 kg)     GEN: Patient is in no acute distress HEENT: Normal NECK: No JVD; No carotid bruits LYMPHATICS: No lymphadenopathy CARDIAC: Hear sounds regular, 2/6 systolic murmur at the apex. RESPIRATORY:  Clear to auscultation without rales, wheezing or rhonchi  ABDOMEN: Soft, non-tender, non-distended MUSCULOSKELETAL:  No edema; No deformity  SKIN: Warm and dry NEUROLOGIC:  Alert and oriented x 3 PSYCHIATRIC:  Normal affect   Signed, Jenean Lindau, MD  01/03/2021 4:09 PM    [facility]

## 2021-01-04 LAB — HEPATIC FUNCTION PANEL
ALT: 22 IU/L (ref 0–44)
AST: 18 IU/L (ref 0–40)
Albumin: 5.2 g/dL — ABNORMAL HIGH (ref 4.0–5.0)
Alkaline Phosphatase: 60 IU/L (ref 44–121)
Bilirubin Total: 2.1 mg/dL — ABNORMAL HIGH (ref 0.0–1.2)
Bilirubin, Direct: 0.2 mg/dL (ref 0.00–0.40)
Total Protein: 7.5 g/dL (ref 6.0–8.5)

## 2021-01-04 LAB — LIPID PANEL
Chol/HDL Ratio: 3.5 ratio (ref 0.0–5.0)
Cholesterol, Total: 180 mg/dL (ref 100–199)
HDL: 51 mg/dL (ref >39)
LDL Chol Calc (NIH): 111 mg/dL — ABNORMAL HIGH (ref 0–99)
Triglycerides: 101 mg/dL (ref 0–149)
VLDL Cholesterol Cal: 18 mg/dL (ref 5–40)

## 2021-01-04 LAB — BASIC METABOLIC PANEL
BUN/Creatinine Ratio: 15 (ref 9–20)
BUN: 15 mg/dL (ref 6–24)
CO2: 20 mmol/L (ref 20–29)
Calcium: 9.5 mg/dL (ref 8.7–10.2)
Chloride: 100 mmol/L (ref 96–106)
Creatinine, Ser: 1.02 mg/dL (ref 0.76–1.27)
Glucose: 94 mg/dL (ref 65–99)
Potassium: 4 mmol/L (ref 3.5–5.2)
Sodium: 139 mmol/L (ref 134–144)
eGFR: 93 mL/min/{1.73_m2} (ref >59)

## 2021-01-07 MED ORDER — ATORVASTATIN CALCIUM 20 MG PO TABS: 20.0000 mg | ORAL_TABLET | Freq: Every day | ORAL | 3 refills | Status: DC

## 2021-01-07 NOTE — Addendum Note (Signed)
Addended by: Truddie Hidden on: 01/07/2021 09:32 AM   Modules accepted: Orders

## 2021-03-11 DIAGNOSIS — E559 Vitamin D deficiency, unspecified: Secondary | ICD-10-CM | POA: Diagnosis not present

## 2021-03-11 DIAGNOSIS — R5383 Other fatigue: Secondary | ICD-10-CM | POA: Diagnosis not present

## 2021-03-11 DIAGNOSIS — Z008 Encounter for other general examination: Secondary | ICD-10-CM | POA: Diagnosis not present

## 2021-03-11 DIAGNOSIS — F40243 Fear of flying: Secondary | ICD-10-CM | POA: Diagnosis not present

## 2021-03-11 DIAGNOSIS — Z79899 Other long term (current) drug therapy: Secondary | ICD-10-CM | POA: Diagnosis not present

## 2021-07-21 DIAGNOSIS — Z0000 Encounter for general adult medical examination without abnormal findings: Secondary | ICD-10-CM | POA: Diagnosis not present

## 2021-07-21 DIAGNOSIS — E559 Vitamin D deficiency, unspecified: Secondary | ICD-10-CM | POA: Diagnosis not present

## 2021-07-21 DIAGNOSIS — R5383 Other fatigue: Secondary | ICD-10-CM | POA: Diagnosis not present

## 2021-07-21 DIAGNOSIS — F17210 Nicotine dependence, cigarettes, uncomplicated: Secondary | ICD-10-CM | POA: Diagnosis not present

## 2021-07-21 DIAGNOSIS — F411 Generalized anxiety disorder: Secondary | ICD-10-CM | POA: Diagnosis not present

## 2021-07-21 DIAGNOSIS — Z79899 Other long term (current) drug therapy: Secondary | ICD-10-CM | POA: Diagnosis not present

## 2021-08-11 DIAGNOSIS — Z1211 Encounter for screening for malignant neoplasm of colon: Secondary | ICD-10-CM | POA: Diagnosis not present

## 2021-08-19 DIAGNOSIS — I77810 Thoracic aortic ectasia: Secondary | ICD-10-CM | POA: Diagnosis not present

## 2021-08-28 DIAGNOSIS — I77810 Thoracic aortic ectasia: Secondary | ICD-10-CM | POA: Diagnosis not present

## 2021-10-27 DIAGNOSIS — E7800 Pure hypercholesterolemia, unspecified: Secondary | ICD-10-CM | POA: Diagnosis not present

## 2021-10-27 DIAGNOSIS — Z6833 Body mass index (BMI) 33.0-33.9, adult: Secondary | ICD-10-CM | POA: Diagnosis not present

## 2021-10-27 DIAGNOSIS — M62838 Other muscle spasm: Secondary | ICD-10-CM | POA: Diagnosis not present

## 2021-10-27 DIAGNOSIS — R635 Abnormal weight gain: Secondary | ICD-10-CM | POA: Diagnosis not present

## 2021-10-27 DIAGNOSIS — Z79899 Other long term (current) drug therapy: Secondary | ICD-10-CM | POA: Diagnosis not present

## 2021-10-27 DIAGNOSIS — E559 Vitamin D deficiency, unspecified: Secondary | ICD-10-CM | POA: Diagnosis not present

## 2021-11-03 ENCOUNTER — Other Ambulatory Visit: Payer: Self-pay | Admitting: Cardiology

## 2021-11-03 DIAGNOSIS — E782 Mixed hyperlipidemia: Secondary | ICD-10-CM

## 2022-01-26 DIAGNOSIS — R42 Dizziness and giddiness: Secondary | ICD-10-CM | POA: Diagnosis not present

## 2022-01-26 DIAGNOSIS — E7800 Pure hypercholesterolemia, unspecified: Secondary | ICD-10-CM | POA: Diagnosis not present

## 2022-01-26 DIAGNOSIS — Z131 Encounter for screening for diabetes mellitus: Secondary | ICD-10-CM | POA: Diagnosis not present

## 2022-01-26 DIAGNOSIS — F411 Generalized anxiety disorder: Secondary | ICD-10-CM | POA: Diagnosis not present

## 2022-01-26 DIAGNOSIS — E559 Vitamin D deficiency, unspecified: Secondary | ICD-10-CM | POA: Diagnosis not present

## 2022-01-26 DIAGNOSIS — Z79899 Other long term (current) drug therapy: Secondary | ICD-10-CM | POA: Diagnosis not present

## 2022-01-26 DIAGNOSIS — R635 Abnormal weight gain: Secondary | ICD-10-CM | POA: Diagnosis not present

## 2022-01-26 DIAGNOSIS — Z6831 Body mass index (BMI) 31.0-31.9, adult: Secondary | ICD-10-CM | POA: Diagnosis not present

## 2022-02-25 ENCOUNTER — Other Ambulatory Visit: Payer: Self-pay

## 2022-02-25 DIAGNOSIS — E782 Mixed hyperlipidemia: Secondary | ICD-10-CM

## 2022-02-25 MED ORDER — ATORVASTATIN CALCIUM 20 MG PO TABS: 20.0000 mg | ORAL_TABLET | Freq: Every day | ORAL | 0 refills | Status: DC

## 2022-03-25 ENCOUNTER — Other Ambulatory Visit: Payer: Self-pay

## 2022-03-25 DIAGNOSIS — E782 Mixed hyperlipidemia: Secondary | ICD-10-CM

## 2022-03-25 MED ORDER — ATORVASTATIN CALCIUM 20 MG PO TABS: 20.0000 mg | ORAL_TABLET | Freq: Every day | ORAL | 0 refills | Status: AC
